# Patient Record
Sex: Female | Born: 1945 | Race: White | Hispanic: No | Marital: Married | Smoking: Never smoker
Health system: Southern US, Community
[De-identification: ages and names within clinical notes are randomized; demographics above are authoritative.]

## PROBLEM LIST (undated history)

## (undated) DIAGNOSIS — F32A Depression, unspecified: Secondary | ICD-10-CM

## (undated) DIAGNOSIS — F329 Major depressive disorder, single episode, unspecified: Secondary | ICD-10-CM

## (undated) DIAGNOSIS — F319 Bipolar disorder, unspecified: Secondary | ICD-10-CM

## (undated) HISTORY — DX: Major depressive disorder, single episode, unspecified: F32.9

## (undated) HISTORY — DX: Depression, unspecified: F32.A

## (undated) HISTORY — PX: TONSILLECTOMY: SUR1361

## (undated) HISTORY — DX: Bipolar disorder, unspecified: F31.9

---

## 2012-11-26 ENCOUNTER — Ambulatory Visit (INDEPENDENT_AMBULATORY_CARE_PROVIDER_SITE_OTHER): Payer: Medicare Other | Admitting: Family

## 2012-11-26 ENCOUNTER — Encounter: Payer: Self-pay | Admitting: Family

## 2012-11-26 VITALS — BP 140/80 | HR 71 | Temp 98.2°F | Ht 63.0 in | Wt 206.0 lb

## 2012-11-26 DIAGNOSIS — R635 Abnormal weight gain: Secondary | ICD-10-CM

## 2012-11-26 DIAGNOSIS — F329 Major depressive disorder, single episode, unspecified: Secondary | ICD-10-CM

## 2012-11-26 MED ORDER — RISPERIDONE 0.5 MG PO TABS: 0.5000 mg | ORAL_TABLET | Freq: Every day | ORAL | Status: DC

## 2012-11-26 NOTE — Progress Notes (Signed)
  Subjective:    Patient ID: Nancy Soto, female    DOB: 03/11/1946, 67 y.o.   MRN: [id number]  HPI 67 year old WF, nonsmoker, new patient is in today to be established. She has a history of depression and takes Risperdal and is currently stable. Last CPX 1 year ago. 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 Has had a flu shot, PNA vaccine, and Shingles vaccine. Last colonoscopy in 2006.    Review of Systems  Constitutional: Negative.   Respiratory: Negative.   Cardiovascular: Negative.   Gastrointestinal: Negative.   Musculoskeletal: Negative.   Skin: Negative.   Neurological: Negative.   Hematological: Negative.   Psychiatric/Behavioral: Negative.    Past Medical History  Diagnosis Date  . Depression     History   Social History  . Marital Status: Married    Spouse Name: N/A    Number of Children: N/A  . Years of Education: N/A   Occupational History  . Not on file.   Social History Main Topics  . Smoking status: Never Smoker   . Smokeless tobacco: Not on file  . Alcohol Use: No  . Drug Use: Not on file  . Sexually Active: Not on file   Other Topics Concern  . Not on file   Social History Narrative  . No narrative on file    No past surgical history on file.  Family History  Problem Relation Age of Onset  . Cancer Mother     breast  . Alcohol abuse Mother   . Heart disease Father   . Alcohol abuse Father     No Known Allergies  Current Outpatient Prescriptions on File Prior to Visit  Medication Sig Dispense Refill  . risperiDONE (RISPERDAL) 0.5 MG tablet Take 1 tablet (0.5 mg total) by mouth at bedtime.  90 tablet  1    BP 140/80  Pulse 71  Temp 98.2 F (36.8 C) (Oral)  Ht 5\' 3"  (1.6 m)  Wt 206 lb (93.441 kg)  BMI 36.49 kg/m2  SpO2 97%chart    Objective:   Physical Exam  Constitutional: She is oriented to person, place, and time. She appears well-developed and well-nourished.  HENT:  Right  Ear: External ear normal.  Left Ear: External ear normal.  Nose: Nose normal.  Mouth/Throat: Oropharynx is clear and moist.  Neck: Normal range of motion. Neck supple.  Cardiovascular: Normal rate, regular rhythm and normal heart sounds.   Pulmonary/Chest: Effort normal and breath sounds normal.  Abdominal: Soft. Bowel sounds are normal.  Neurological: She is alert and oriented to person, place, and time. She has normal reflexes.  Skin: Skin is warm and dry.  Psychiatric: She has a normal mood and affect.          Assessment & Plan:  Assessment: Depression, Weight Gain  Plan: Continue current meds. Labs sent. Encourage a healthy diet and exercise. Return in 2 weeks for CPX. Meds renewed.

## 2012-11-26 NOTE — Patient Instructions (Signed)

## 2013-05-03 ENCOUNTER — Other Ambulatory Visit: Payer: Self-pay

## 2013-05-03 MED ORDER — RISPERIDONE 0.5 MG PO TABS: 0.5000 mg | ORAL_TABLET | Freq: Every day | ORAL | Status: DC

## 2013-08-31 ENCOUNTER — Ambulatory Visit (INDEPENDENT_AMBULATORY_CARE_PROVIDER_SITE_OTHER): Payer: Medicare Other

## 2013-08-31 DIAGNOSIS — Z23 Encounter for immunization: Secondary | ICD-10-CM

## 2013-11-28 ENCOUNTER — Telehealth: Payer: Self-pay

## 2013-11-28 MED ORDER — RISPERIDONE 0.5 MG PO TABS: 0.5000 mg | ORAL_TABLET | Freq: Every day | ORAL | Status: DC

## 2013-11-28 NOTE — Telephone Encounter (Signed)
Will you check and see if she needs refills called in.

## 2014-05-29 ENCOUNTER — Encounter (HOSPITAL_COMMUNITY): Payer: Self-pay | Admitting: Emergency Medicine

## 2014-05-29 ENCOUNTER — Ambulatory Visit (INDEPENDENT_AMBULATORY_CARE_PROVIDER_SITE_OTHER): Payer: Medicare Other | Admitting: Family

## 2014-05-29 ENCOUNTER — Emergency Department (HOSPITAL_COMMUNITY)
Admission: EM | Admit: 2014-05-29 | Discharge: 2014-05-30 | Disposition: A | Discharge status: Home / Self Care | Payer: Medicare Other | Attending: Emergency Medicine | Admitting: Emergency Medicine

## 2014-05-29 ENCOUNTER — Encounter: Payer: Self-pay | Admitting: Family

## 2014-05-29 VITALS — BP 124/80 | HR 72 | Temp 98.6°F | Ht 63.0 in | Wt 194.0 lb

## 2014-05-29 DIAGNOSIS — F319 Bipolar disorder, unspecified: Secondary | ICD-10-CM | POA: Insufficient documentation

## 2014-05-29 DIAGNOSIS — F22 Delusional disorders: Secondary | ICD-10-CM | POA: Diagnosis present

## 2014-05-29 DIAGNOSIS — IMO0002 Reserved for concepts with insufficient information to code with codable children: Secondary | ICD-10-CM | POA: Insufficient documentation

## 2014-05-29 LAB — COMPREHENSIVE METABOLIC PANEL
ALT: 15 U/L (ref 0–35)
AST: 17 U/L (ref 0–37)
Albumin: 4.2 g/dL (ref 3.5–5.2)
Alkaline Phosphatase: 105 U/L (ref 39–117)
Anion gap: 14 (ref 5–15)
BUN: 16 mg/dL (ref 6–23)
CALCIUM: 9.9 mg/dL (ref 8.4–10.5)
CO2: 25 meq/L (ref 19–32)
CREATININE: 0.78 mg/dL (ref 0.50–1.10)
Chloride: 102 mEq/L (ref 96–112)
GFR, EST NON AFRICAN AMERICAN: 83 mL/min — AB (ref >90)
GLUCOSE: 102 mg/dL — AB (ref 70–99)
Potassium: 4.6 mEq/L (ref 3.7–5.3)
SODIUM: 141 meq/L (ref 137–147)
Total Bilirubin: 0.3 mg/dL (ref 0.3–1.2)
Total Protein: 8 g/dL (ref 6.0–8.3)

## 2014-05-29 LAB — ACETAMINOPHEN LEVEL: Acetaminophen (Tylenol), Serum: <15 ug/mL (ref 10–30)

## 2014-05-29 LAB — RAPID URINE DRUG SCREEN, HOSP PERFORMED
Amphetamines: NOT DETECTED
Barbiturates: NOT DETECTED
Benzodiazepines: NOT DETECTED
Cocaine: NOT DETECTED
OPIATES: NOT DETECTED
Tetrahydrocannabinol: NOT DETECTED

## 2014-05-29 LAB — CBC
HCT: 42.2 % (ref 36.0–46.0)
HEMOGLOBIN: 14 g/dL (ref 12.0–15.0)
MCH: 31.2 pg (ref 26.0–34.0)
MCHC: 33.2 g/dL (ref 30.0–36.0)
MCV: 94 fL (ref 78.0–100.0)
PLATELETS: 271 10*3/uL (ref 150–400)
RBC: 4.49 MIL/uL (ref 3.87–5.11)
RDW: 14 % (ref 11.5–15.5)
WBC: 9.9 10*3/uL (ref 4.0–10.5)

## 2014-05-29 LAB — SALICYLATE LEVEL

## 2014-05-29 LAB — ETHANOL

## 2014-05-29 MED ORDER — RISPERIDONE 0.5 MG PO TABS
0.5000 mg | ORAL_TABLET | Freq: Every day | ORAL | Status: DC
  Administered 2014-05-30: 0.5 mg via ORAL
  Filled 2014-05-29: qty 1

## 2014-05-29 NOTE — Progress Notes (Signed)
Pre visit review using our clinic review tool, if applicable. No additional management support is needed unless otherwise documented below in the visit note. 

## 2014-05-29 NOTE — ED Notes (Signed)
Pt is IVC 
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
## 2014-05-29 NOTE — ED Notes (Addendum)
Pts husband took pt to [facility] due to flight of ideas. Pt states she is an illegal person in this country and people are getting mad that she is here. Pt states the gov/t is making her stay. Denies SI/HI
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
## 2014-05-30 ENCOUNTER — Encounter (HOSPITAL_COMMUNITY): Payer: Self-pay | Admitting: Registered Nurse

## 2014-05-30 DIAGNOSIS — F22 Delusional disorders: Secondary | ICD-10-CM

## 2014-05-30 NOTE — BH Assessment (Signed)
Clinician attempted to contact husband to gather collateral information. Message was left to [facility].  Nancy Soto, MSW, LCSW Triage Specialist [phone]

## 2014-05-30 NOTE — ED Notes (Addendum)
Pt continues to walk back and forth to bathroom. Pt is pleasant and cooperative, slept through the night. Preparing for TTS consult.

## 2014-05-30 NOTE — Consult Note (Signed)
[facility] Consult   Reason for Consult:  Delusional Referring Physician:  EDP  Nancy Soto is an 68 y.o. female. Total Time spent with patient: 45 minutes  Assessment: AXIS I:  Delusional Disorder AXIS II:  Deferred AXIS III:   Past Medical History  Diagnosis Date  . Depression    AXIS IV:  other psychosocial or environmental problems AXIS V:  21-30 behavior considerably influenced by delusions or hallucinations OR serious impairment in judgment, communication OR inability to function in almost all areas  Plan:  Recommend psychiatric Inpatient admission when medically cleared.  Subjective:   Nancy Soto is a 68 y.o. female patient admitted with Delusional Disorder.  HPI:  Patient presented to [facility] related to IVC.  Patient states "My doctor told me to come here to get checked out for psych evaluation.  I don't know why.  Patient presents with appropriate affect, Alert and oriented to person, place, time, and situation.  Patient states "I was born in [place]; I don't know why they have a hard time believing that."  Patient then went on to describe how she came to the [place] when she was 23 or 68 yr old; being sick and in hospital. Patient also states that she only has a brother still living and he lives in [place].  Patient states that she has been married for 8 years.   Patient brought papers and birth certificate to hospital show that patient was not born in [place].   Patient denies suicidal/homicidal ideation, psychosis, and paranoia.  HPI Elements:   Location:  Delusional. Quality:  Believes she was born in [place]  . Severity:  Delusional. Timing:  several weeks.  Past Psychiatric History: Past Medical History  Diagnosis Date  . Depression     reports that she has never smoked. She does not have any smokeless tobacco history on file. She reports that she does not drink alcohol. Her drug history is not on file. Family History  Problem  Relation Age of Onset  . Cancer Mother     breast  . Alcohol abuse Mother   . Heart disease Father   . Alcohol abuse Father    Family History Substance Abuse: No Family Supports:  (unk) Living Arrangements: Spouse/significant other Can pt return to current living arrangement?: Yes Abuse/Neglect [facility]) Physical Abuse: Denies Verbal Abuse: Denies Sexual Abuse: Denies Allergies:  No Known Allergies  ACT Assessment Complete:  Yes:    Educational Status    Risk to Self: Risk to self Suicidal Ideation: No Suicidal Intent: No Is patient at risk for suicide?: No Suicidal Plan?: No Access to Means: No What has been your use of drugs/alcohol within the last 12 months?:  (none) Previous Attempts/Gestures: No How many times?: 0 Other Self Harm Risks:  (none reported) Triggers for Past Attempts: None known Intentional Self Injurious Behavior: None Family Suicide History: Yes Recent stressful life event(s): Other (Comment) Persecutory voices/beliefs?: Yes Depression: No Substance abuse history and/or treatment for substance abuse?: No Suicide prevention information given to non-admitted patients: Not applicable  Risk to Others: Risk to Others Homicidal Ideation: No Thoughts of Harm to Others: No Current Homicidal Intent: No Current Homicidal Plan: No Access to Homicidal Means: No Identified Victim:  (NA) History of harm to others?: Yes Assessment of Violence: In past 6-12 months Violent Behavior Description:  (Pt cooperative at assessment. ) Does patient have access to weapons?: No Criminal Charges Pending?: No Does patient have a court date: No  Abuse: Abuse/Neglect Assessment (  Assessment to be complete while patient is alone) Physical Abuse: Denies Verbal Abuse: Denies Sexual Abuse: Denies Exploitation of patient/patient's resources: Denies Self-Neglect: Denies  Prior Inpatient Therapy: Prior Inpatient Therapy Prior Inpatient Therapy: No Prior Therapy Dates:  (NA) Prior  Therapy Facilty/Provider(s):  (NA) Reason for Treatment: NA  Prior Outpatient Therapy: Prior Outpatient Therapy Prior Outpatient Therapy: Yes Prior Therapy Dates:  (NA) Prior Therapy Facilty/Provider(s):  (NA) Reason for Treatment:  (NA)  Additional Information: Additional Information 1:1 In Past 12 Months?: No CIRT Risk: No Elopement Risk: No Does patient have medical clearance?: Yes                  Objective: Blood pressure 149/80, pulse 75, temperature 97.5 F (36.4 C), temperature source Oral, resp. rate 19, SpO2 98.00%.There is no weight on file to calculate BMI. Results for orders placed during the hospital encounter of 05/29/14 (from the past 72 hour(s))  ACETAMINOPHEN LEVEL     Status: None   Collection Time    05/29/14  6:25 PM      Result Value Ref Range   Acetaminophen (Tylenol), Serum <15.0  10 - 30 ug/mL   Comment:            THERAPEUTIC CONCENTRATIONS VARY     SIGNIFICANTLY. A RANGE OF 10-30     ug/mL MAY BE AN EFFECTIVE     CONCENTRATION FOR MANY PATIENTS.     HOWEVER, SOME ARE BEST TREATED     AT CONCENTRATIONS OUTSIDE THIS     RANGE.     ACETAMINOPHEN CONCENTRATIONS     >150 ug/mL AT 4 HOURS AFTER     INGESTION AND >50 ug/mL AT 12     HOURS AFTER INGESTION ARE     OFTEN ASSOCIATED WITH TOXIC     REACTIONS.  CBC     Status: None   Collection Time    05/29/14  6:25 PM      Result Value Ref Range   WBC 9.9  4.0 - 10.5 K/uL   RBC 4.49  3.87 - 5.11 MIL/uL   Hemoglobin 14.0  12.0 - 15.0 g/dL   HCT 42.2  36.0 - 46.0 %   MCV 94.0  78.0 - 100.0 fL   MCH 31.2  26.0 - 34.0 pg   MCHC 33.2  30.0 - 36.0 g/dL   RDW 14.0  11.5 - 15.5 %   Platelets 271  150 - 400 K/uL  COMPREHENSIVE METABOLIC PANEL     Status: Abnormal   Collection Time    05/29/14  6:25 PM      Result Value Ref Range   Sodium 141  137 - 147 mEq/L   Potassium 4.6  3.7 - 5.3 mEq/L   Chloride 102  96 - 112 mEq/L   CO2 25  19 - 32 mEq/L   Glucose, Bld 102 (*) 70 - 99 mg/dL   BUN  16  6 - 23 mg/dL   Creatinine, Ser 0.78  0.50 - 1.10 mg/dL   Calcium 9.9  8.4 - 10.5 mg/dL   Total Protein 8.0  6.0 - 8.3 g/dL   Albumin 4.2  3.5 - 5.2 g/dL   AST 17  0 - 37 U/L   ALT 15  0 - 35 U/L   Alkaline Phosphatase 105  39 - 117 U/L   Total Bilirubin 0.3  0.3 - 1.2 mg/dL   GFR calc non Af Amer 83 (*) >90 mL/min   GFR calc Af Amer >90  >  90 mL/min   Comment: (NOTE)     The eGFR has been calculated using the CKD EPI equation.     This calculation has not been validated in all clinical situations.     eGFR's persistently <90 mL/min signify possible Chronic Kidney     Disease.   Anion gap 14  5 - 15  ETHANOL     Status: None   Collection Time    05/29/14  6:25 PM      Result Value Ref Range   Alcohol, Ethyl (B) <11  0 - 11 mg/dL   Comment:            LOWEST DETECTABLE LIMIT FOR     SERUM ALCOHOL IS 11 mg/dL     FOR MEDICAL PURPOSES ONLY  SALICYLATE LEVEL     Status: Abnormal   Collection Time    05/29/14  6:25 PM      Result Value Ref Range   Salicylate Lvl <4.0 (*) 2.8 - 20.0 mg/dL  URINE RAPID DRUG SCREEN (HOSP PERFORMED)     Status: None   Collection Time    05/29/14  6:40 PM      Result Value Ref Range   Opiates NONE DETECTED  NONE DETECTED   Cocaine NONE DETECTED  NONE DETECTED   Benzodiazepines NONE DETECTED  NONE DETECTED   Amphetamines NONE DETECTED  NONE DETECTED   Tetrahydrocannabinol NONE DETECTED  NONE DETECTED   Barbiturates NONE DETECTED  NONE DETECTED   Comment:            DRUG SCREEN FOR MEDICAL PURPOSES     ONLY.  IF CONFIRMATION IS NEEDED     FOR ANY PURPOSE, NOTIFY LAB     WITHIN 5 DAYS.                LOWEST DETECTABLE LIMITS     FOR URINE DRUG SCREEN     Drug Class       Cutoff (ng/mL)     Amphetamine      1000     Barbiturate      200     Benzodiazepine   981     Tricyclics       191     Opiates          300     Cocaine          300     THC              50   Labs are reviewed see above values.  Medications reviewed and no changes  made.  Current Facility-Administered Medications  Medication Dose Route Frequency Provider Last Rate Last Dose  . risperiDONE (RISPERDAL) tablet 0.5 mg  0.5 mg Oral QHS Babette Relic, MD   0.5 mg at 05/30/14 4782   Current Outpatient Prescriptions  Medication Sig Dispense Refill  . risperiDONE (RISPERDAL) 0.5 MG tablet Take 1 tablet (0.5 mg total) by mouth at bedtime.  90 tablet  1    Psychiatric Specialty Exam:     Blood pressure 149/80, pulse 75, temperature 97.5 F (36.4 C), temperature source Oral, resp. rate 19, SpO2 98.00%.There is no weight on file to calculate BMI.  General Appearance: Casual and Fairly Groomed  Eye Contact::  Good  Speech:  Clear and Coherent and Normal Rate  Volume:  Normal  Mood:  "I feel good"  Affect:  Appropriate  Thought Process:  Coherent and Delusional  Orientation:  Full (Time, Place, and  Person)  Thought Content:  Delusions  Suicidal Thoughts:  No  Homicidal Thoughts:  No  Memory:  Immediate;   Good Recent;   Good Remote;   Good  Judgement:  Impaired  Insight:  Present  Psychomotor Activity:  Normal  Concentration:  Fair  Recall:  Good  Fund of Knowledge:Good  Language: Good  Akathisia:  No  Handed:  Right  AIMS (if indicated):     Assets:  Communication Skills Desire for Improvement Social Support Transportation  Sleep:      Musculoskeletal: Strength & Muscle Tone: within normal limits Gait & Station: normal Patient leans: N/A  Treatment Plan Summary: Daily contact with patient to assess and evaluate symptoms and progress in treatment Medication management  Inpatient treatment recommended. Patient accepted to [facility] for inpatient treatment.  Will continue to monitor for safety and stabilization until transferred.  Rankin, Shuvon, FNP-BC 05/30/2014 3:21 PM

## 2014-05-30 NOTE — Discharge Instructions (Signed)
Hallucinations and Delusions  You seem to be having hallucinations and/or delusions. You may be hearing voices that no one else can hear. This can seem very real to you. You may be having thoughts and fears that do not make sense to others. This condition can be due to mental disease like schizophrenia. It may be caused by a medical condition, such as an infection or electrolyte disturbance. These symptoms are also seen in drug abusers, especially those who use crack cocaine and amphetamines. Drugs like PCP, LSD, MDMA, peyote, and psilocybin can also cause frightening hallucinations and loss of control.  If your symptoms are due to drug abuse, your mental state should improve as the drug(s) leave your system. Someone you trust should be with you until you are better to protect you and calm your fears. Often tranquilizers are very helpful at controlling hallucinations, anxiety, and destructive behavior. Getting a proper diet and enough sleep is important to recovery. If your symptoms are not due to drugs, or do not improve over several days after stopping drug use, you need further medical or mental health care.  SEEK IMMEDIATE MEDICAL CARE IF:   · Your symptoms get worse, especially if you think your life is in danger  · You have violent or destructive thoughts.  Recovery is possible, but you have to get proper treatment and avoid drugs that are known to cause you trouble.  Document Released: 12/18/2004 Document Revised: 02/02/2012 Document Reviewed: 11/10/2005  ExitCare® Patient Information ©2015 [employer]. This information is not intended to replace advice given to you by your health care provider. Make sure you discuss any questions you have with your health care provider.

## 2014-05-30 NOTE — ED Notes (Signed)
Husbands number [phone] (Max)

## 2014-05-30 NOTE — BH Assessment (Signed)
Clinician consulted with Nancy SievertSpencer Simon, NP who recommends pt to be seen by psychiatry in the AM.   Nancy Soto, MSW, LCSW Triage Specialist [phone]
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
## 2014-05-31 NOTE — Consult Note (Signed)
Face to face evaluation with this evaluation

## 2014-06-20 ENCOUNTER — Telehealth: Payer: Self-pay

## 2014-06-20 NOTE — Telephone Encounter (Signed)
Spoke with pt's husband. He is requesting that we call Dr. Walen Italyhad Stephens office because he is who Rxd Abilify for the pt and it is not working. The copay is also $189 and he would like something cheaper. He states that pt is worse than she was before she went into the hospital.  I advised that I would give them call and see if they will contact him to schedule her an appointment

## 2014-06-20 NOTE — Telephone Encounter (Signed)
Spoke with pt's husband and gave him the number that was given to me for him to get an appointment for pt. Number provided to me was [phone]

## 2014-07-11 ENCOUNTER — Telehealth: Payer: Self-pay | Admitting: Family

## 2014-07-11 ENCOUNTER — Other Ambulatory Visit: Payer: Self-pay | Admitting: Family

## 2014-07-11 NOTE — Telephone Encounter (Signed)
Has been sent in already

## 2014-07-11 NOTE — Telephone Encounter (Signed)
Pt request refill of the following:   risperiDONE (RISPERDAL) 0.5 MG tablet    Phamacy:  [facility] [address]

## 2014-08-03 ENCOUNTER — Other Ambulatory Visit: Payer: Self-pay | Admitting: Family

## 2014-09-22 ENCOUNTER — Ambulatory Visit (INDEPENDENT_AMBULATORY_CARE_PROVIDER_SITE_OTHER): Payer: Medicare Other

## 2014-09-22 DIAGNOSIS — Z23 Encounter for immunization: Secondary | ICD-10-CM

## 2014-12-22 ENCOUNTER — Other Ambulatory Visit: Payer: Self-pay | Admitting: Family

## 2015-04-11 ENCOUNTER — Telehealth: Payer: Self-pay

## 2015-04-11 NOTE — Telephone Encounter (Signed)
Left message on husbands voicemail to call back and letter sent

## 2015-04-27 ENCOUNTER — Other Ambulatory Visit: Payer: Self-pay

## 2015-04-27 DIAGNOSIS — Z1231 Encounter for screening mammogram for malignant neoplasm of breast: Secondary | ICD-10-CM

## 2015-06-21 ENCOUNTER — Other Ambulatory Visit: Payer: Self-pay | Admitting: Family

## 2015-06-26 ENCOUNTER — Other Ambulatory Visit: Payer: Self-pay | Admitting: Family

## 2015-07-03 ENCOUNTER — Encounter: Payer: Self-pay | Admitting: Adult Health

## 2015-07-03 ENCOUNTER — Ambulatory Visit (INDEPENDENT_AMBULATORY_CARE_PROVIDER_SITE_OTHER): Payer: Medicare Other | Admitting: Adult Health

## 2015-07-03 VITALS — BP 108/72 | Temp 97.7°F | Ht 63.0 in | Wt 189.9 lb

## 2015-07-03 DIAGNOSIS — F319 Bipolar disorder, unspecified: Secondary | ICD-10-CM | POA: Insufficient documentation

## 2015-07-03 DIAGNOSIS — F22 Delusional disorders: Secondary | ICD-10-CM

## 2015-07-03 DIAGNOSIS — Z7189 Other specified counseling: Secondary | ICD-10-CM | POA: Diagnosis not present

## 2015-07-03 DIAGNOSIS — Z7689 Persons encountering health services in other specified circumstances: Secondary | ICD-10-CM
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
## 2015-07-03 NOTE — Progress Notes (Signed)
Pre visit review using our clinic review tool, if applicable. No additional management support is needed unless otherwise documented below in the visit note. 

## 2015-07-03 NOTE — Patient Instructions (Signed)
Please follow up in one month for a complete physical  I would like you to see psychiatry  We will set up a mammgram for you.

## 2015-08-02 ENCOUNTER — Emergency Department (HOSPITAL_COMMUNITY): Payer: Medicare Other

## 2015-08-02 ENCOUNTER — Emergency Department (HOSPITAL_COMMUNITY)
Admission: EM | Admit: 2015-08-02 | Discharge: 2015-08-03 | Disposition: A | Discharge status: Other Acute Inpatient Hospital | Payer: Medicare Other | Attending: Emergency Medicine | Admitting: Emergency Medicine

## 2015-08-02 ENCOUNTER — Encounter (HOSPITAL_COMMUNITY): Payer: Self-pay

## 2015-08-02 DIAGNOSIS — Z01818 Encounter for other preprocedural examination: Secondary | ICD-10-CM

## 2015-08-02 DIAGNOSIS — R456 Violent behavior: Secondary | ICD-10-CM | POA: Insufficient documentation

## 2015-08-02 DIAGNOSIS — R258 Other abnormal involuntary movements: Secondary | ICD-10-CM | POA: Diagnosis not present

## 2015-08-02 DIAGNOSIS — Z8659 Personal history of other mental and behavioral disorders: Secondary | ICD-10-CM | POA: Insufficient documentation

## 2015-08-02 DIAGNOSIS — Z046 Encounter for general psychiatric examination, requested by authority: Secondary | ICD-10-CM | POA: Diagnosis present

## 2015-08-02 DIAGNOSIS — F319 Bipolar disorder, unspecified: Secondary | ICD-10-CM | POA: Diagnosis not present

## 2015-08-02 DIAGNOSIS — R9431 Abnormal electrocardiogram [ECG] [EKG]: Secondary | ICD-10-CM | POA: Diagnosis not present

## 2015-08-02 LAB — URINALYSIS, ROUTINE W REFLEX MICROSCOPIC
Bilirubin Urine: NEGATIVE
Glucose, UA: NEGATIVE mg/dL
HGB URINE DIPSTICK: NEGATIVE
Ketones, ur: NEGATIVE mg/dL
Nitrite: NEGATIVE
PROTEIN: NEGATIVE mg/dL
SPECIFIC GRAVITY, URINE: 1.02 (ref 1.005–1.030)
UROBILINOGEN UA: 0.2 mg/dL (ref 0.0–1.0)
pH: 5.5 (ref 5.0–8.0)

## 2015-08-02 LAB — COMPREHENSIVE METABOLIC PANEL
ALK PHOS: 105 U/L (ref 38–126)
ALT: 19 U/L (ref 14–54)
AST: 22 U/L (ref 15–41)
Albumin: 5 g/dL (ref 3.5–5.0)
Anion gap: 12 (ref 5–15)
BILIRUBIN TOTAL: 1 mg/dL (ref 0.3–1.2)
BUN: 17 mg/dL (ref 6–20)
CALCIUM: 9.5 mg/dL (ref 8.9–10.3)
CO2: 23 mmol/L (ref 22–32)
Chloride: 103 mmol/L (ref 101–111)
Creatinine, Ser: 0.87 mg/dL (ref 0.44–1.00)
GFR calc non Af Amer: >60 mL/min (ref >60)
Glucose, Bld: 120 mg/dL — ABNORMAL HIGH (ref 65–99)
Potassium: 3.5 mmol/L (ref 3.5–5.1)
Sodium: 138 mmol/L (ref 135–145)
Total Protein: 8.5 g/dL — ABNORMAL HIGH (ref 6.5–8.1)

## 2015-08-02 LAB — CBC WITH DIFFERENTIAL/PLATELET
BASOS ABS: 0 10*3/uL (ref 0.0–0.1)
Basophils Relative: 0 % (ref 0–1)
EOS PCT: 1 % (ref 0–5)
Eosinophils Absolute: 0.1 10*3/uL (ref 0.0–0.7)
HCT: 42.4 % (ref 36.0–46.0)
Hemoglobin: 14.3 g/dL (ref 12.0–15.0)
LYMPHS ABS: 2 10*3/uL (ref 0.7–4.0)
Lymphocytes Relative: 18 % (ref 12–46)
MCH: 31.7 pg (ref 26.0–34.0)
MCHC: 33.7 g/dL (ref 30.0–36.0)
MCV: 94 fL (ref 78.0–100.0)
MONO ABS: 0.9 10*3/uL (ref 0.1–1.0)
Monocytes Relative: 8 % (ref 3–12)
Neutro Abs: 8.1 10*3/uL — ABNORMAL HIGH (ref 1.7–7.7)
Neutrophils Relative %: 73 % (ref 43–77)
PLATELETS: 289 10*3/uL (ref 150–400)
RBC: 4.51 MIL/uL (ref 3.87–5.11)
RDW: 14.2 % (ref 11.5–15.5)
WBC: 11 10*3/uL — AB (ref 4.0–10.5)

## 2015-08-02 LAB — URINE MICROSCOPIC-ADD ON

## 2015-08-02 LAB — RAPID URINE DRUG SCREEN, HOSP PERFORMED
Amphetamines: NOT DETECTED
Barbiturates: NOT DETECTED
Benzodiazepines: NOT DETECTED
Cocaine: NOT DETECTED
Opiates: NOT DETECTED
Tetrahydrocannabinol: NOT DETECTED

## 2015-08-02 LAB — ETHANOL

## 2015-08-02 MED ORDER — RISPERIDONE 0.5 MG PO TABS
0.5000 mg | ORAL_TABLET | Freq: Every day | ORAL | Status: DC
  Administered 2015-08-02: 0.5 mg via ORAL
  Filled 2015-08-02: qty 1

## 2015-08-02 NOTE — BH Assessment (Signed)
08-02-15 Accepted to [facility], under the care of Dr. Betti Cruz, to arrive after 9 am 08-03-15, report to be called to [phone] Per Aisha.   Informed RN.    Clista Bernhardt, [facility] Triage Specialist 08/02/2015 9:48 PM
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
## 2015-08-02 NOTE — ED Notes (Signed)
Bed: WA28 Expected date:  Expected time:  Means of arrival:  Comments: 
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
## 2015-08-02 NOTE — Progress Notes (Addendum)
Delorise Shiner at [facility] requested EKG, chest xray, and IVC papers for patient. Per Delorise Shiner, "we may have a  Bed for her". RN aware.  [place] fax number: [phone].   Melbourne Abts, LCSWA Disposition staff 08/02/2015 7:25 PM
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
## 2015-08-03 NOTE — ED Notes (Signed)
Pt is awake and alert, pacing the room. Denies HI/SI.  Will continue to monitor for safety.

## 2015-08-04 LAB — URINE CULTURE: SPECIAL REQUESTS: NORMAL

## 2015-08-09 DIAGNOSIS — I10 Essential (primary) hypertension: Secondary | ICD-10-CM | POA: Diagnosis not present

## 2015-08-09 NOTE — ED Provider Notes (Signed)
Medical screening examination/treatment/procedure(s) were performed by non-physician practitioner and as supervising physician I was immediately available for consultation/collaboration.   EKG Interpretation   Date/Time:  Thursday August 02 2015 15:47:29 EDT Ventricular Rate:  73 PR Interval:  134 QRS Duration: 74 QT Interval:  382 QTC Calculation: 420 R Axis:   -24 Text Interpretation:  Normal sinus rhythm Possible Left atrial enlargement  Borderline ECG ED PHYSICIAN INTERPRETATION AVAILABLE IN [facility]  Confirmed by TEST, Record ([id number]) on 08/03/2015 8:24:05 AM       Raeford Razor, MD 08/09/15 1335

## 2015-08-21 ENCOUNTER — Telehealth: Payer: Self-pay | Admitting: Adult Health

## 2015-08-21 NOTE — Telephone Encounter (Signed)
Notified by Harmon Pier. Mr. Mroz was advised to take Mrs. Algeo to [facility] ER to be evaluated.
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
## 2015-08-22 ENCOUNTER — Ambulatory Visit (INDEPENDENT_AMBULATORY_CARE_PROVIDER_SITE_OTHER): Payer: Medicare Other | Admitting: Adult Health

## 2015-08-22 ENCOUNTER — Encounter: Payer: Self-pay | Admitting: Adult Health

## 2015-08-22 VITALS — BP 112/70 | HR 82 | Temp 98.1°F | Ht 63.0 in | Wt 190.5 lb

## 2015-08-22 DIAGNOSIS — F99 Mental disorder, not otherwise specified: Secondary | ICD-10-CM | POA: Diagnosis not present

## 2015-08-22 DIAGNOSIS — Z23 Encounter for immunization: Secondary | ICD-10-CM

## 2015-08-22 MED ORDER — RISPERIDONE 2 MG PO TABS: 2.0000 mg | ORAL_TABLET | Freq: Two times a day (BID) | ORAL | Status: DC

## 2015-08-22 NOTE — Patient Instructions (Addendum)
It was great seeing you again   You have a prescription for Risperdal , you are to take this twice a day, one in the morning and one in the evening.   I want you to follow up with me in 2 weeks so I can see how you are doing.   If you need anything in the meantime, please let me know.
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
## 2015-08-22 NOTE — Progress Notes (Signed)
Pre visit review using our clinic review tool, if applicable. No additional management support is needed unless otherwise documented below in the visit note. 

## 2015-09-05 ENCOUNTER — Ambulatory Visit: Payer: Self-pay | Admitting: Adult Health

## 2015-12-31 ENCOUNTER — Telehealth: Payer: Self-pay | Admitting: Adult Health

## 2015-12-31 DIAGNOSIS — F99 Mental disorder, not otherwise specified: Secondary | ICD-10-CM

## 2015-12-31 MED ORDER — RISPERIDONE 2 MG PO TABS: 2.0000 mg | ORAL_TABLET | Freq: Two times a day (BID) | ORAL | Status: DC

## 2015-12-31 NOTE — Telephone Encounter (Signed)
Pt request refill of the following: risperiDONE (RISPERDAL) 2 MG tablet   Phamacy:  [facility] [address]

## 2015-12-31 NOTE — Telephone Encounter (Signed)
Rx sent to pharmacy   

## 2015-12-31 NOTE — Telephone Encounter (Signed)
Ok to refill for three months 

## 2016-01-20 IMAGING — CR DG CHEST 2V
2 series · 2 of 2 positions shown · non-contrast
Comparison: None.

CLINICAL DATA: Depression/bipolar disorder. Pre-admission
evaluation

EXAM:
CHEST  2 VIEW

[w chest lat]
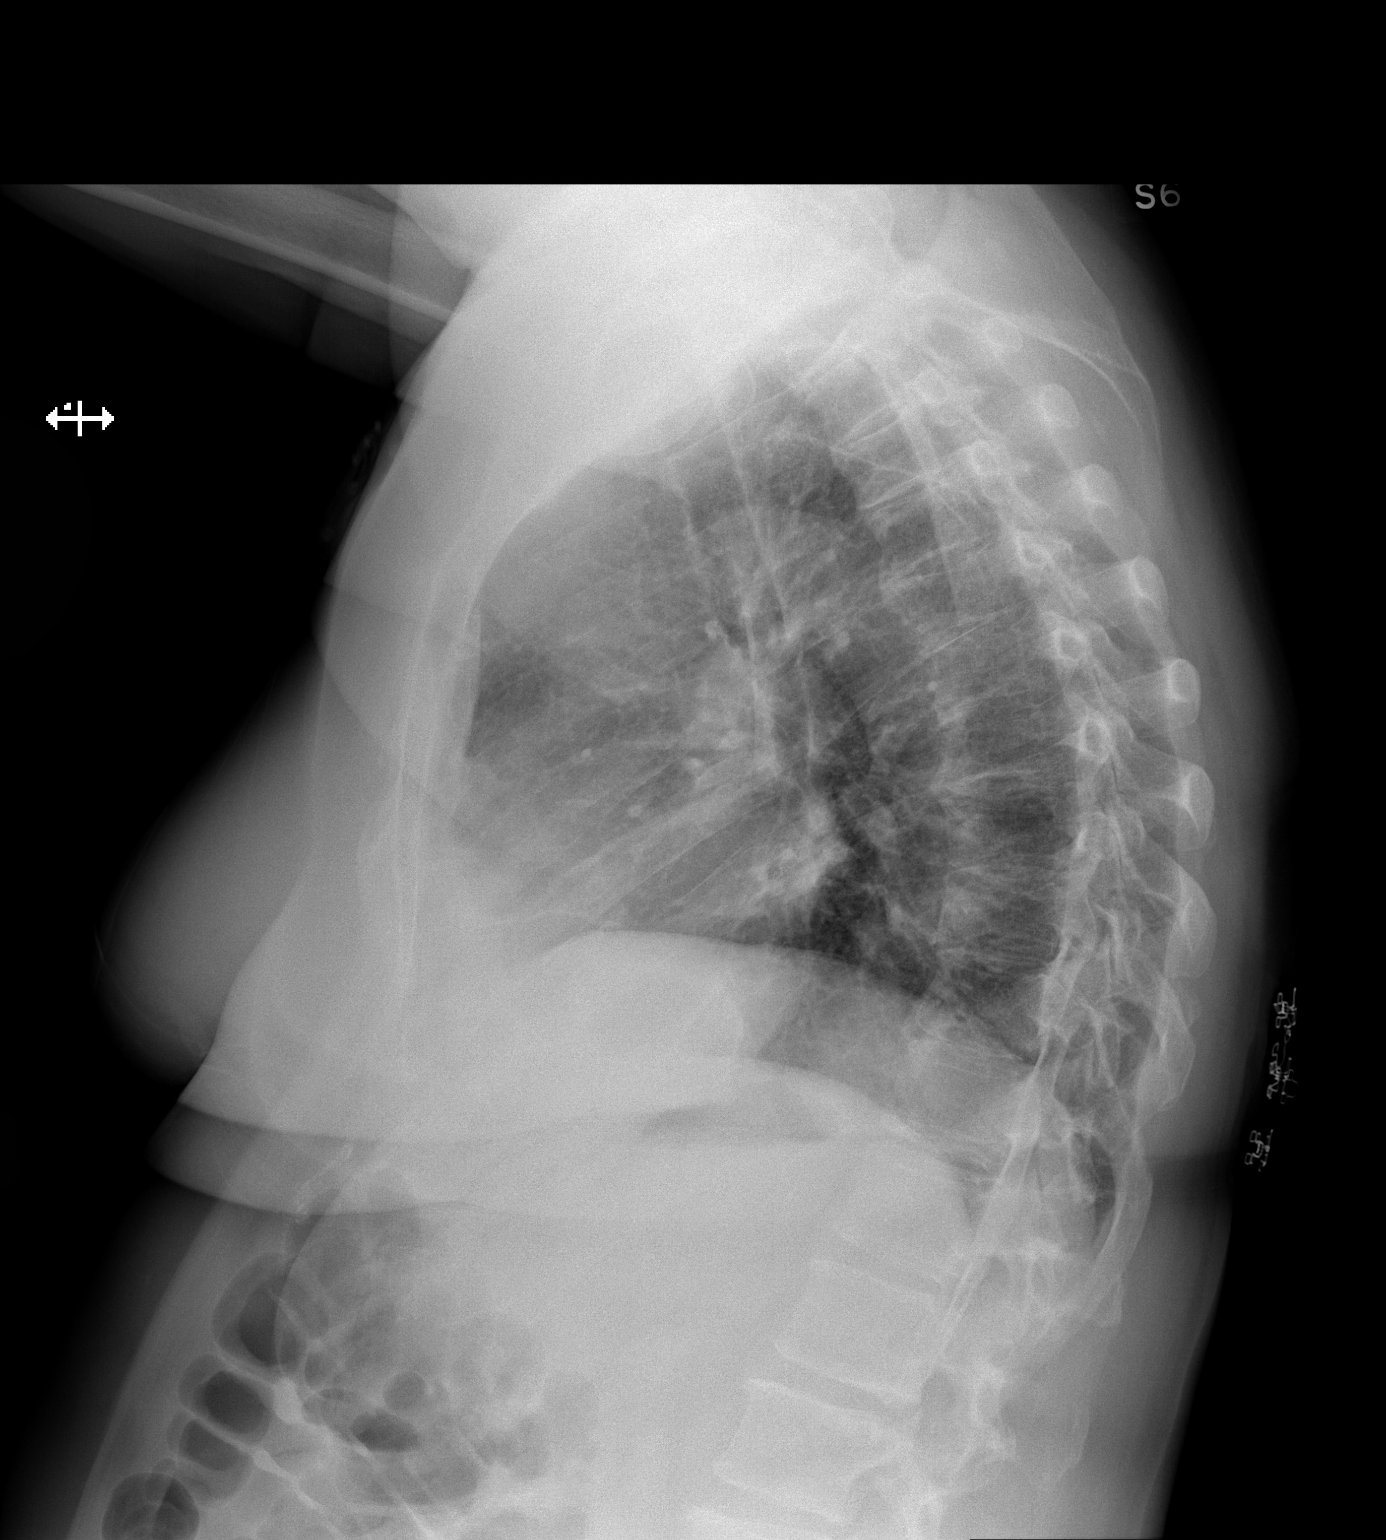

[w chest pa]
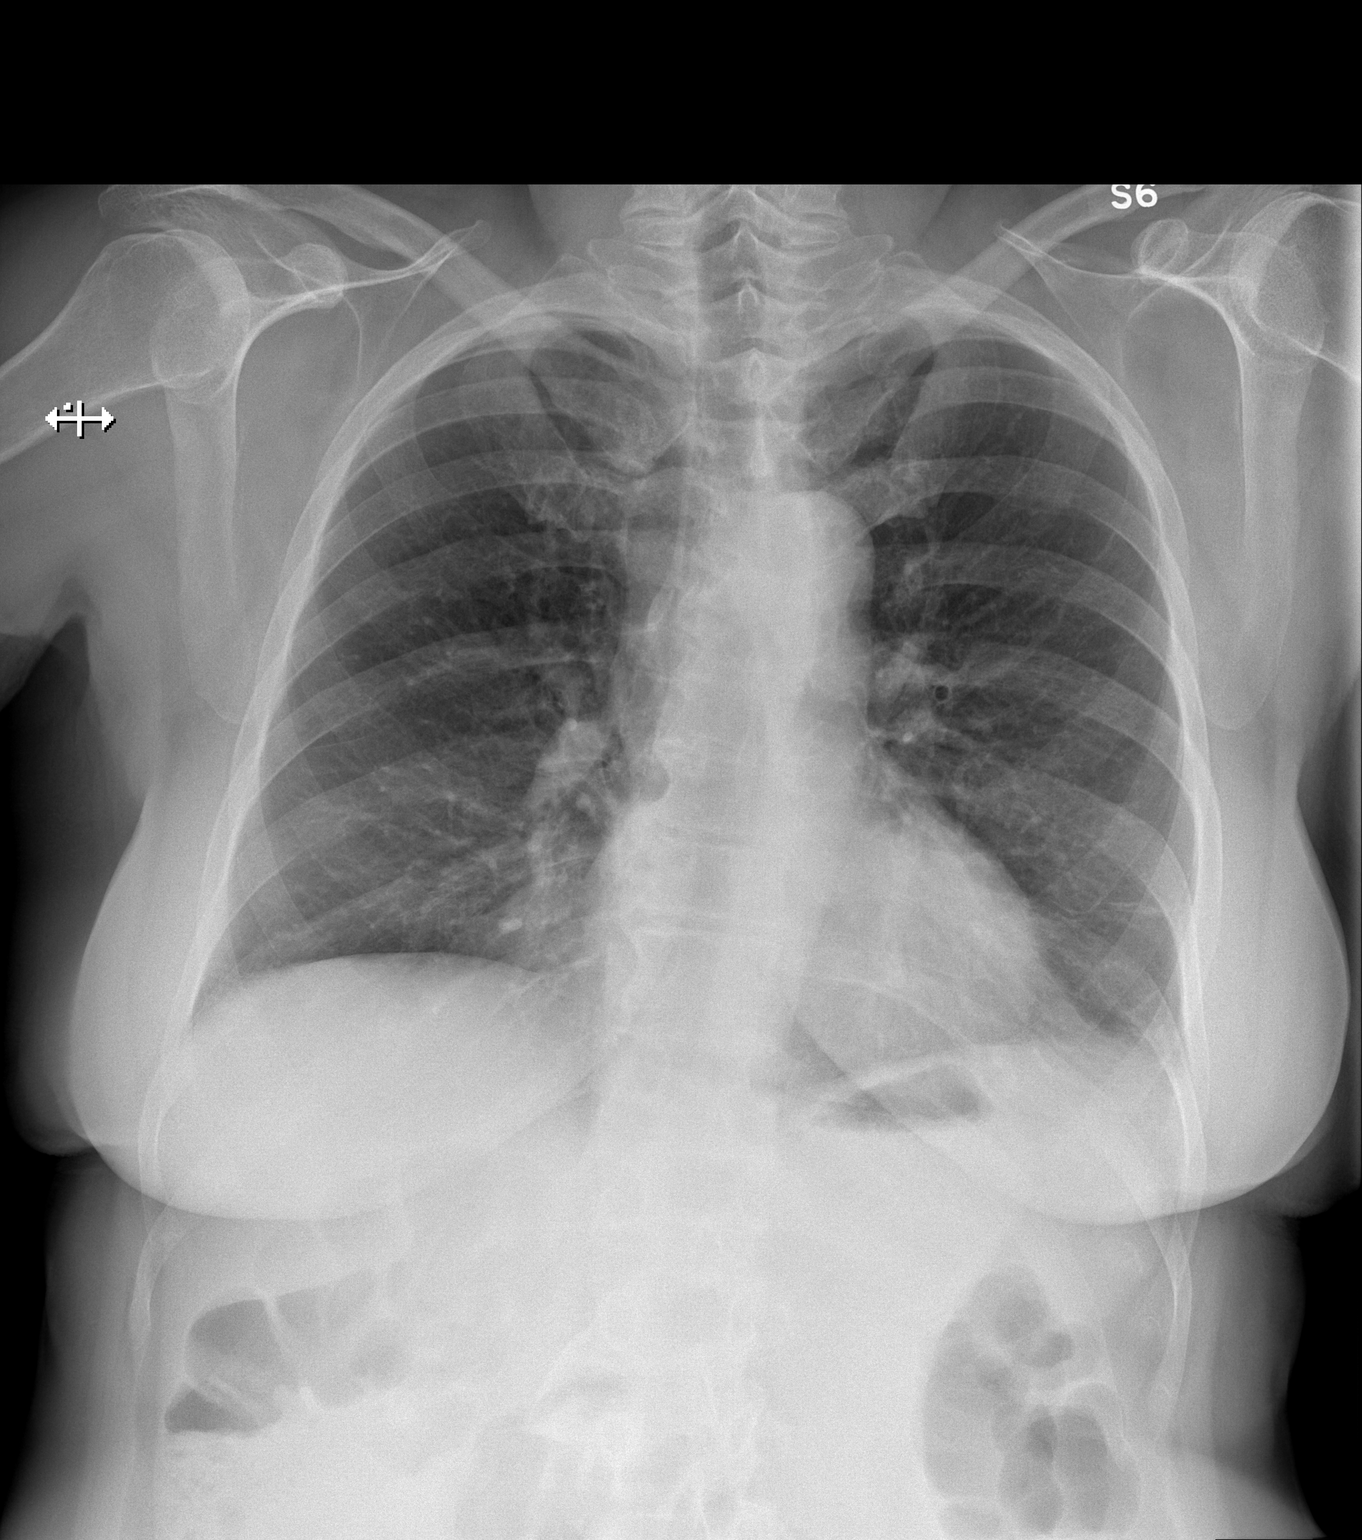

[2 of 2 positions shown; findings below may reference images not displayed]

FINDINGS: There is minimal scarring in the left base. Lungs elsewhere clear.
Heart size and pulmonary vascularity are normal. No adenopathy.
There is degenerative change in the thoracic spine.
IMPRESSION: No edema or consolidation.  Minimal scarring left base.

## 2016-05-16 ENCOUNTER — Other Ambulatory Visit: Payer: Self-pay | Admitting: Adult Health

## 2016-05-16 NOTE — Telephone Encounter (Signed)
Ok to refill for 3 months.  

## 2016-05-16 NOTE — Telephone Encounter (Signed)
Pt is requesting a refill. Last filled on 12/31/15 #60 + 3, last OV 08/22/15. No follow up scheduled. Ok to refill?

## 2016-07-30 ENCOUNTER — Ambulatory Visit (INDEPENDENT_AMBULATORY_CARE_PROVIDER_SITE_OTHER): Payer: PPO | Admitting: Adult Health

## 2016-07-30 ENCOUNTER — Encounter: Payer: Self-pay | Admitting: Adult Health

## 2016-07-30 VITALS — BP 118/64 | Temp 98.6°F | Ht 63.0 in | Wt 183.5 lb

## 2016-07-30 DIAGNOSIS — Z23 Encounter for immunization: Secondary | ICD-10-CM

## 2016-07-30 DIAGNOSIS — F3170 Bipolar disorder, currently in remission, most recent episode unspecified: Secondary | ICD-10-CM

## 2016-07-30 MED ORDER — RISPERIDONE 2 MG PO TABS: ORAL_TABLET | ORAL | 3 refills | Status: DC
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
## 2016-07-30 NOTE — Patient Instructions (Signed)
It was great seeing you today!  I have sent in a refill of your medication.   Please follow up with me in November for your physical.

## 2016-09-26 ENCOUNTER — Encounter: Payer: Self-pay | Admitting: Adult Health

## 2016-12-31 ENCOUNTER — Ambulatory Visit (INDEPENDENT_AMBULATORY_CARE_PROVIDER_SITE_OTHER): Payer: PPO | Admitting: Adult Health

## 2016-12-31 VITALS — BP 126/64 | Temp 97.8°F | Ht 63.0 in | Wt 177.2 lb

## 2016-12-31 DIAGNOSIS — Z1211 Encounter for screening for malignant neoplasm of colon: Secondary | ICD-10-CM

## 2016-12-31 DIAGNOSIS — Z0000 Encounter for general adult medical examination without abnormal findings: Secondary | ICD-10-CM | POA: Diagnosis not present

## 2016-12-31 DIAGNOSIS — F3170 Bipolar disorder, currently in remission, most recent episode unspecified: Secondary | ICD-10-CM | POA: Diagnosis not present

## 2016-12-31 LAB — POC URINALSYSI DIPSTICK (AUTOMATED)
BILIRUBIN UA: NEGATIVE
GLUCOSE UA: NEGATIVE
Nitrite, UA: NEGATIVE
PH UA: 7
Protein, UA: NEGATIVE
Spec Grav, UA: 1.02
Urobilinogen, UA: 0.2

## 2016-12-31 MED ORDER — RISPERIDONE 2 MG PO TABS: ORAL_TABLET | ORAL | 3 refills | Status: DC

## 2016-12-31 NOTE — Progress Notes (Signed)
Subjective:    Patient ID: Nancy LenzPatricia A Soto, female    DOB: [birth year], 71 y.o.   MRN: [id number]  HPI  Patient presents for yearly preventative medicine examination. She is a pleasant 71 year old female who  has a past medical history of Bipolar disorder (HCC) and Depression.   All immunizations and health maintenance protocols were reviewed with the patient and needed orders were placed.She does not want her pneumonia vaccination at this time  Appropriate screening laboratory values were ordered for the patient including screening of hyperlipidemia, renal function and hepatic function.  Medication reconciliation,  past medical history, social history, problem list and allergies were reviewed in detail with the patient  Goals were established with regard to weight loss, exercise, and  diet in compliance with medications. She reports that she is trying to eat healthy and is doing ab workouts to help lose weight   She is due for colonoscopy. She is up-to-date on vision and dental screenings.  He has had no recent acute issues dealing with mental health disorders. He is currently taking Risperdal 2 mg twice a day and feels as though this is helping her greatly  He has no acute issues that she would like to talk about today  Review of Systems  Constitutional: Negative.   HENT: Negative.   Eyes: Negative.   Respiratory: Negative.   Cardiovascular: Negative.   Gastrointestinal: Negative.   Endocrine: Negative.   Genitourinary: Negative.   Musculoskeletal: Negative.   Skin: Negative.   Neurological: Negative.   Hematological: Negative.   Psychiatric/Behavioral: Negative.   All other systems reviewed and are negative.  Past Medical History:  Diagnosis Date  . Bipolar disorder (HCC)   . Depression     Social History   Social History  . Marital status: Married    Spouse name: N/A  . Number of children: N/A  . Years of education: N/A   Occupational History  . Not on  file.   Social History Main Topics  . Smoking status: Never Smoker  . Smokeless tobacco: Not on file  . Alcohol use No  . Drug use: No  . Sexual activity: Not on file   Other Topics Concern  . Not on file   Social History Narrative  . No narrative on file    Past Surgical History:  Procedure Laterality Date  . TONSILLECTOMY      Family History  Problem Relation Age of Onset  . Cancer Mother     breast and ovarian  . Alcohol abuse Mother   . Heart disease Father   . Alcohol abuse Father     No Known Allergies  Current Outpatient Prescriptions on File Prior to Visit  Medication Sig Dispense Refill  . risperiDONE (RISPERDAL) 2 MG tablet TAKE 1 TABLET TWICE A DAY. 1 TABLET IN THE MORNING AND 1 TABLET IN THE EVENING 60 tablet 3   No current facility-administered medications on file prior to visit.     BP 126/64   Temp 97.8 F (36.6 C) (Oral)   Ht 5\' 3"  (1.6 m)   Wt 177 lb 3.2 oz (80.4 kg)   BMI 31.39 kg/m       Objective:   Physical Exam  Constitutional: She is oriented to person, place, and time. She appears well-developed and well-nourished. No distress.  HENT:  Head: Normocephalic and atraumatic.  Right Ear: External ear normal.  Left Ear: External ear normal.  Nose: Nose normal.  Mouth/Throat: Oropharynx is clear and  moist. No oropharyngeal exudate.  Eyes: Conjunctivae and EOM are normal. Pupils are equal, round, and reactive to light. Right eye exhibits no discharge. Left eye exhibits no discharge. No scleral icterus.  Neck: Normal range of motion. Neck supple. No JVD present. Carotid bruit is not present. No tracheal deviation present. No thyromegaly present.  Cardiovascular: Normal rate, regular rhythm, normal heart sounds and intact distal pulses.  Exam reveals no gallop and no friction rub.   No murmur heard. Pulmonary/Chest: Effort normal and breath sounds normal. No stridor. No respiratory distress. She has no wheezes. She has no rales. She exhibits  no tenderness.  Abdominal: Soft. Bowel sounds are normal. She exhibits no distension and no mass. There is no tenderness. There is no rebound and no guarding.  Genitourinary:  Genitourinary Comments: Refused.    Musculoskeletal: Normal range of motion. She exhibits no edema, tenderness or deformity.  Lymphadenopathy:    She has no cervical adenopathy.  Neurological: She is alert and oriented to person, place, and time. She has normal reflexes. She displays normal reflexes. No cranial nerve deficit. She exhibits normal muscle tone. Coordination normal.  Skin: Skin is warm and dry. No rash noted. She is not diaphoretic. No erythema. No pallor.  Psychiatric: She has a normal mood and affect. Her behavior is normal. Judgment and thought content normal.  Nursing note and vitals reviewed.      Assessment & Plan:  1. Routine general medical examination at a health care facility - Basic metabolic panel - CBC with Differential/Platelet - Hepatic function panel - Lipid panel - POCT Urinalysis Dipstick (Automated) - TSH - Continue to exercise and eat healthy. Encouraged to walk on a nightly basis - Follow-up in 6 months sooner if needed 2. Bipolar affective disorder in remission [facility]) - This current dose seems to be working well for her. No change in medication at this time - risperiDONE (RISPERDAL) 2 MG tablet; TAKE 1 TABLET TWICE A DAY. 1 TABLET IN THE MORNING AND 1 TABLET IN THE EVENING  Dispense: 60 tablet; Refill: 3 - Follow-up in 6 months  3. Colon cancer screening  - Ambulatory referral to Gastroenterology  Shirline Frees, NP

## 2017-01-01 LAB — LIPID PANEL
CHOL/HDL RATIO: 3
CHOLESTEROL: 195 mg/dL (ref 0–200)
HDL: 62.6 mg/dL (ref >39.00)
LDL CALC: 114 mg/dL — AB (ref 0–99)
NonHDL: 132.37
TRIGLYCERIDES: 92 mg/dL (ref 0.0–149.0)
VLDL: 18.4 mg/dL (ref 0.0–40.0)

## 2017-01-01 LAB — HEPATIC FUNCTION PANEL
ALT: 48 U/L — AB (ref 0–35)
AST: 21 U/L (ref 0–37)
Albumin: 4.6 g/dL (ref 3.5–5.2)
Alkaline Phosphatase: 88 U/L (ref 39–117)
BILIRUBIN DIRECT: 0.1 mg/dL (ref 0.0–0.3)
BILIRUBIN TOTAL: 0.9 mg/dL (ref 0.2–1.2)
Total Protein: 7.6 g/dL (ref 6.0–8.3)

## 2017-01-01 LAB — BASIC METABOLIC PANEL
BUN: 10 mg/dL (ref 6–23)
CHLORIDE: 103 meq/L (ref 96–112)
CO2: 29 mEq/L (ref 19–32)
CREATININE: 0.8 mg/dL (ref 0.40–1.20)
Calcium: 9.7 mg/dL (ref 8.4–10.5)
GFR: 74.8 mL/min (ref >60.00)
GLUCOSE: 96 mg/dL (ref 70–99)
POTASSIUM: 4.2 meq/L (ref 3.5–5.1)
Sodium: 141 mEq/L (ref 135–145)

## 2017-01-01 LAB — CBC WITH DIFFERENTIAL/PLATELET
BASOS PCT: 0.5 % (ref 0.0–3.0)
Basophils Absolute: 0 10*3/uL (ref 0.0–0.1)
EOS PCT: 0.5 % (ref 0.0–5.0)
Eosinophils Absolute: 0 10*3/uL (ref 0.0–0.7)
HCT: 40.1 % (ref 36.0–46.0)
HEMOGLOBIN: 13.5 g/dL (ref 12.0–15.0)
LYMPHS ABS: 1.4 10*3/uL (ref 0.7–4.0)
Lymphocytes Relative: 14.1 % (ref 12.0–46.0)
MCHC: 33.5 g/dL (ref 30.0–36.0)
MCV: 95.6 fl (ref 78.0–100.0)
MONO ABS: 0.9 10*3/uL (ref 0.1–1.0)
Monocytes Relative: 9.4 % (ref 3.0–12.0)
NEUTROS ABS: 7.4 10*3/uL (ref 1.4–7.7)
NEUTROS PCT: 75.5 % (ref 43.0–77.0)
PLATELETS: 375 10*3/uL (ref 150.0–400.0)
RBC: 4.2 Mil/uL (ref 3.87–5.11)
RDW: 15.4 % (ref 11.5–15.5)
WBC: 9.8 10*3/uL (ref 4.0–10.5)

## 2017-01-01 LAB — TSH: TSH: 1.22 u[IU]/mL (ref 0.35–4.50)

## 2017-01-30 ENCOUNTER — Encounter: Payer: Self-pay | Admitting: Adult Health

## 2017-06-20 ENCOUNTER — Other Ambulatory Visit: Payer: Self-pay | Admitting: Adult Health

## 2017-06-20 DIAGNOSIS — F3170 Bipolar disorder, currently in remission, most recent episode unspecified: Secondary | ICD-10-CM

## 2017-06-23 ENCOUNTER — Telehealth: Payer: Self-pay | Admitting: Family Medicine

## 2017-06-23 NOTE — Telephone Encounter (Signed)
Sent to the pharmacy for 30 days.  She is due for follow up in Aug.  Message sent to scheduling to help her get on the schedule.

## 2017-06-23 NOTE — Telephone Encounter (Signed)
Pt is due for a medication follow up in August.  Please help the pt to get on the schedule.  Thanks!!!

## 2017-06-24 NOTE — Telephone Encounter (Signed)
Left message on VM for pt to call back.

## 2017-07-01 NOTE — Telephone Encounter (Signed)
Husband Max has pt's phone and he is in the hospital recovering from hip replacement. He states pt does not drive and he will not be able to bring her until September most likely.  Would like me to call back the first of September.

## 2017-07-23 ENCOUNTER — Other Ambulatory Visit: Payer: Self-pay | Admitting: Adult Health

## 2017-07-23 DIAGNOSIS — F3170 Bipolar disorder, currently in remission, most recent episode unspecified: Secondary | ICD-10-CM

## 2017-07-23 NOTE — Telephone Encounter (Signed)
Ok to refill for 6 months 

## 2017-07-23 NOTE — Telephone Encounter (Signed)
Last filled 06/23/17 #60 Last seen on 12/31/16 Currently due for medication follow up but husband in the hospital. No future appointment scheduled at this time.  Please advise.  Thanks!!

## 2017-07-23 NOTE — Telephone Encounter (Signed)
Called to the pharmacy and left on machine. 

## 2017-10-16 ENCOUNTER — Telehealth: Payer: Self-pay | Admitting: Adult Health

## 2017-10-16 NOTE — Telephone Encounter (Signed)
Copied from CRM [id number]. Topic: Quick Communication - See Telephone Encounter >> Oct 16, 2017  2:45 PM Percival SpanishKennedy, Cheryl W wrote: CRM for notification. See Telephone encounter for:     risperiDONE (RISPERDAL) 2 MG tablet     [facility]    10/16/17.

## 2017-10-16 NOTE — Telephone Encounter (Signed)
Medication needs provider approval. Thanks. 

## 2017-10-21 NOTE — Telephone Encounter (Signed)
Please do PA. Thanks.

## 2017-10-23 NOTE — Telephone Encounter (Signed)
Prior auth sent for Risperidone to [email or web address]#[id number].

## 2017-10-30 NOTE — Telephone Encounter (Signed)
I called [facility] at [phone] and spoke with D'Angela and she stated this is a paid claim-no Berkley Harveyauth is needed.  [facility] called [facility] and informed Trey PaulaJeff of this.

## 2018-01-16 ENCOUNTER — Other Ambulatory Visit: Payer: Self-pay | Admitting: Adult Health

## 2018-01-16 DIAGNOSIS — F3170 Bipolar disorder, currently in remission, most recent episode unspecified: Secondary | ICD-10-CM

## 2018-01-19 NOTE — Telephone Encounter (Signed)
Ok to refill for 6 months 

## 2018-01-19 NOTE — Telephone Encounter (Signed)
Sent to the pharmacy by e-scribe as directed. 

## 2018-04-18 ENCOUNTER — Other Ambulatory Visit: Payer: Self-pay | Admitting: Adult Health

## 2018-04-18 DIAGNOSIS — F3170 Bipolar disorder, currently in remission, most recent episode unspecified: Secondary | ICD-10-CM

## 2018-04-20 NOTE — Telephone Encounter (Signed)
FILLED ON 01/19/2018 FOR 6 MONTHS.  MESSAGE SENT TO THE PHARMACY TO CHECK FOR REFILLS.

## 2018-04-28 DIAGNOSIS — F319 Bipolar disorder, unspecified: Secondary | ICD-10-CM | POA: Diagnosis not present

## 2018-04-28 DIAGNOSIS — R102 Pelvic and perineal pain: Secondary | ICD-10-CM | POA: Diagnosis not present

## 2018-04-28 DIAGNOSIS — R109 Unspecified abdominal pain: Secondary | ICD-10-CM | POA: Diagnosis not present

## 2018-04-28 DIAGNOSIS — N390 Urinary tract infection, site not specified: Secondary | ICD-10-CM | POA: Diagnosis not present

## 2018-05-19 DIAGNOSIS — F319 Bipolar disorder, unspecified: Secondary | ICD-10-CM | POA: Diagnosis not present

## 2018-08-10 DIAGNOSIS — Z23 Encounter for immunization: Secondary | ICD-10-CM | POA: Diagnosis not present

## 2018-09-27 DIAGNOSIS — R32 Unspecified urinary incontinence: Secondary | ICD-10-CM | POA: Diagnosis not present

## 2018-09-27 DIAGNOSIS — Z13220 Encounter for screening for lipoid disorders: Secondary | ICD-10-CM | POA: Diagnosis not present

## 2018-09-27 DIAGNOSIS — Z1231 Encounter for screening mammogram for malignant neoplasm of breast: Secondary | ICD-10-CM | POA: Diagnosis not present

## 2018-09-27 DIAGNOSIS — N3000 Acute cystitis without hematuria: Secondary | ICD-10-CM | POA: Diagnosis not present

## 2018-09-27 DIAGNOSIS — F319 Bipolar disorder, unspecified: Secondary | ICD-10-CM | POA: Diagnosis not present

## 2018-09-29 DIAGNOSIS — F319 Bipolar disorder, unspecified: Secondary | ICD-10-CM | POA: Diagnosis not present

## 2018-09-29 DIAGNOSIS — Z13220 Encounter for screening for lipoid disorders: Secondary | ICD-10-CM | POA: Diagnosis not present

## 2018-09-30 DIAGNOSIS — Z1231 Encounter for screening mammogram for malignant neoplasm of breast: Secondary | ICD-10-CM | POA: Diagnosis not present
